# Patient Record
Sex: Female | Born: 1960 | Race: White | Hispanic: No | Marital: Married | Smoking: Never smoker
Health system: Southern US, Community
[De-identification: ages and names within clinical notes are randomized; demographics above are authoritative.]

## PROBLEM LIST (undated history)

## (undated) DIAGNOSIS — G5600 Carpal tunnel syndrome, unspecified upper limb: Secondary | ICD-10-CM

## (undated) DIAGNOSIS — D563 Thalassemia minor: Secondary | ICD-10-CM

## (undated) HISTORY — PX: ABDOMINAL HYSTERECTOMY: SHX81

## (undated) HISTORY — PX: WISDOM TOOTH EXTRACTION: SHX21

## (undated) HISTORY — PX: CHOLECYSTECTOMY: SHX55

## (undated) HISTORY — DX: Thalassemia minor: D56.3

## (undated) HISTORY — PX: TONSILLECTOMY: SUR1361

## (undated) HISTORY — DX: Carpal tunnel syndrome, unspecified upper limb: G56.00

---

## 2005-01-18 ENCOUNTER — Ambulatory Visit: Payer: Self-pay | Admitting: Obstetrics and Gynecology

## 2005-08-30 ENCOUNTER — Ambulatory Visit: Payer: Self-pay | Admitting: Obstetrics and Gynecology

## 2005-09-04 ENCOUNTER — Other Ambulatory Visit: Payer: Self-pay

## 2005-09-04 ENCOUNTER — Emergency Department: Payer: Self-pay | Admitting: Emergency Medicine

## 2005-09-05 ENCOUNTER — Ambulatory Visit: Payer: Self-pay | Admitting: Emergency Medicine

## 2006-01-31 ENCOUNTER — Ambulatory Visit: Payer: Self-pay | Admitting: Obstetrics and Gynecology

## 2007-05-17 ENCOUNTER — Ambulatory Visit: Payer: Self-pay | Admitting: Obstetrics and Gynecology

## 2008-06-05 ENCOUNTER — Ambulatory Visit: Payer: Self-pay | Admitting: Obstetrics and Gynecology

## 2010-07-22 ENCOUNTER — Ambulatory Visit: Payer: Self-pay | Admitting: Obstetrics and Gynecology

## 2014-10-22 ENCOUNTER — Ambulatory Visit
Admit: 2014-10-22 | Disposition: A | Discharge status: Home / Self Care | Payer: Self-pay | Attending: Nurse Practitioner | Admitting: Nurse Practitioner

## 2015-10-28 ENCOUNTER — Ambulatory Visit: Payer: No Typology Code available for payment source | Attending: Internal Medicine | Admitting: Occupational Therapy

## 2015-10-28 ENCOUNTER — Encounter: Payer: Self-pay | Admitting: Occupational Therapy

## 2015-10-28 DIAGNOSIS — M6281 Muscle weakness (generalized): Secondary | ICD-10-CM | POA: Insufficient documentation

## 2015-10-28 DIAGNOSIS — M25642 Stiffness of left hand, not elsewhere classified: Secondary | ICD-10-CM | POA: Diagnosis present

## 2015-10-28 DIAGNOSIS — M79641 Pain in right hand: Secondary | ICD-10-CM | POA: Diagnosis present

## 2015-10-28 DIAGNOSIS — M79642 Pain in left hand: Secondary | ICD-10-CM | POA: Diagnosis present

## 2015-10-28 DIAGNOSIS — M25641 Stiffness of right hand, not elsewhere classified: Secondary | ICD-10-CM | POA: Diagnosis not present

## 2015-10-28 NOTE — Patient Instructions (Signed)
Pt to do contrast in water   Tendon glides  And digits tapping   Ed on joint protection principles and modifications of tasks, hand out provided

## 2015-10-28 NOTE — Therapy (Signed)
[facility] PHYSICAL AND SPORTS MEDICINE [address] [place], [place], [address] Phone: [phone]   Fax:  [phone]  Occupational Therapy Treatment  Patient Details  Name: Renee Hess MRN: [id number] Date of Birth: June 27, 1961 Referring Provider: Renard MatterBock  Encounter Date: 10/28/2015      OT End of Session - 10/28/15 1310    Visit Number 1   Number of Visits 4   Date for OT Re-Evaluation 11/25/15   OT Start Time 0911   OT Stop Time 1005   OT Time Calculation (min) 54 min   Activity Tolerance Patient tolerated treatment well   Behavior During Therapy [facility] for tasks assessed/performed      No past medical history on file.  Past Surgical History  Procedure Laterality Date  . Cholecystectomy    . Abdominal hysterectomy      partial    There were no vitals filed for this visit.      Subjective Assessment - 10/28/15 1305    Subjective  My hands started hurting about  summer of 2016 - took meds did not help - now more stiff and tight - in am and evenings the worse    Patient Stated Goals Want to get my hands better - do not want it to get worse    Pain Score 2    Pain Location Hand   Pain Orientation Right;Left   Pain Descriptors / Indicators Aching            [facility] OT Assessment - 10/28/15 0001    Assessment   Diagnosis inflammatory arthritis   Referring Provider Renard MatterBock   Onset Date 11/27/14   Home  Environment   Lives With Family   Prior Function   Level of Independence Independent   Vocation Full time employment   Leisure R hand dominant - work as [employer] tech , likes to do puzzles, books, games on tablet ,  tex on phone, movies and do own house work    Chiropractortrength   Right Hand Grip (lbs) 35   Right Hand Lateral Pinch 11 lbs   Right Hand 3 Point Pinch 9 lbs   Left Hand Grip (lbs) 21   Left Hand Lateral Pinch 10 lbs   Left Hand 3 Point Pinch 8 lbs   Right Hand AROM   R Index  MCP 0-90 65 Degrees   R Index PIP  0-100 100 Degrees   R Long  MCP 0-90 70 Degrees   R Long PIP 0-100 100 Degrees   R Ring  MCP 0-90 60 Degrees   R Ring PIP 0-100 95 Degrees   R Little  MCP 0-90 75 Degrees   R Little PIP 0-100 95 Degrees   Left Hand AROM   L Index  MCP 0-90 70 Degrees   L Index PIP 0-100 95 Degrees   L Long  MCP 0-90 75 Degrees   L Long PIP 0-100 95 Degrees   L Ring  MCP 0-90 80 Degrees   L Ring PIP 0-100 95 Degrees   L Little  MCP 0-90 85 Degrees   L Little PIP 0-100 95 Degrees      Pt was ed on HEP for contrast - using water, and tendon glides and AROM tapping  Hand out provided  And ed on joint protection principles and modifications                     OT Education - 10/28/15 1310  Education provided Yes   Education Details HEP and education of jooint protection and modifications    Person(s) Educated Patient   Methods Explanation;Demonstration;Tactile cues;Verbal cues   Comprehension Verbal cues required;Returned demonstration;Verbalized understanding          OT Short Term Goals - 10/28/15 1315    OT SHORT TERM GOAL #1   Title Pain on PRWHE improve with 10 points    Baseline pain at eval 21/50    Time 2   Period Weeks   Status New   OT SHORT TERM GOAL #2   Title AROM in digits in bilateral hands improve to touching palm    Baseline see flowsheet - MC's more lmited than PIP's    Time 4   Period Weeks   Status New           OT Long Term Goals - 10/28/15 1316    OT LONG TERM GOAL #1   Title Pt verbalize 3  joint protection and AE to use in ADL's and IADL"s to increase ease   Baseline no knowledge   Time 4   Period Weeks   Status New   OT LONG TERM GOAL #2   Title Grip strength improve in bilateral hands by 5 lbs to be able to grasp, lift or squeeze objects with more ease   Baseline grip decrease  in bilateral hands - see flowsheet   Time 4   Period Weeks   Status New               Plan - 10/28/15 1311    Clinical Impression Statement  Pt  present with increase sitffness, tighness in bilateral hands - with L worse- pt do have some pain and tenderness / edema over L 2nd and 3rd A1pulley's - pt do report that she had some triggering when this started but not the last few months - did activity analysis - but pt do not had period of increase activiities that could brought her symptoms on - pt  do show decrease ROM in MC's more than PIP's , decrease grip strength more than  prehension - makiing it hard for her to use her hands to grip , lift  or squeeze objjects, opening jars ,cutting food    Rehab Potential Fair   OT Frequency 1x / week   OT Duration 4 weeks   OT Treatment/Interventions Self-care/ADL training;Parrafin;Contrast Bath;Fluidtherapy;Therapeutic exercise;Manual Therapy;DME and/or AE instruction;Patient/family education;Passive range of motion   Plan assess progress with HEP , joint protection    OT Home Exercise Plan see pt instruction    Consulted and Agree with Plan of Care Patient      Patient will benefit from skilled therapeutic intervention in order to improve the following deficits and impairments:  Impaired flexibility, Decreased range of motion, Increased edema, Decreased knowledge of use of DME, Impaired UE functional use, Pain, Decreased strength  Visit Diagnosis: Stiffness of right hand, not elsewhere classified - Plan: Ot plan of care cert/re-cert  Stiffness of left hand, not elsewhere classified - Plan: Ot plan of care cert/re-cert  Pain in left hand - Plan: Ot plan of care cert/re-cert  Pain in right hand - Plan: Ot plan of care cert/re-cert  Muscle weakness (generalized) - Plan: Ot plan of care cert/re-cert    Problem List There are no active problems to display for this patient.   [name] OTR/L,CLT  10/28/2015, 1:22 PM  [facility] PHYSICAL AND SPORTS MEDICINE [address] [place], [place], [address] Phone: [phone]   Fax:  [phone]  Name:  Renee Hess MRN: [id number] Date of Birth: 05/11/1961

## 2015-11-04 ENCOUNTER — Ambulatory Visit: Payer: No Typology Code available for payment source | Admitting: Occupational Therapy

## 2015-11-04 DIAGNOSIS — M6281 Muscle weakness (generalized): Secondary | ICD-10-CM

## 2015-11-04 DIAGNOSIS — M25641 Stiffness of right hand, not elsewhere classified: Secondary | ICD-10-CM

## 2015-11-04 DIAGNOSIS — M25642 Stiffness of left hand, not elsewhere classified: Secondary | ICD-10-CM

## 2015-11-04 DIAGNOSIS — M79641 Pain in right hand: Secondary | ICD-10-CM

## 2015-11-04 DIAGNOSIS — M79642 Pain in left hand: Secondary | ICD-10-CM

## 2015-11-04 NOTE — Therapy (Signed)
[facility] PH[facility] MEDICINE [address] [place], [place], [address] Phone: [phone]   Fax:  [phone]  Occupational Therapy Treatment  Patient Details  Name: Renee Hess MRN: [id number] Date of Birth: Sep 05, 1960 Referring Provider: Renard MatterBock  Encounter Date: 11/04/2015      OT End of Session - 11/04/15 1007    Visit Number 2   Number of Visits 4   Date for OT Re-Evaluation 11/25/15   OT Start Time 0920   OT Stop Time 1001   OT Time Calculation (min) 41 min   Activity Tolerance Patient tolerated treatment well   Behavior During Therapy [facility] for tasks assessed/performed      No past medical history on file.  Past Surgical History  Procedure Laterality Date  . Cholecystectomy    . Abdominal hysterectomy      partial    There were no vitals filed for this visit.      Subjective Assessment - 11/04/15 1001    Subjective  My hands are better - not as tight , full feeling - over the knuckles mostly - and I can see my knuckles more - even my ring not as tight    Patient Stated Goals Want to get my hands better - do not want it to get worse    Currently in Pain? No/denies            [facility] Assessment - 11/04/15 0001    Strength   Right Hand Grip (lbs) 35   Right Hand Lateral Pinch 14 lbs   Right Hand 3 Point Pinch 12 lbs   Left Hand Grip (lbs) 26   Left Hand Lateral Pinch 12 lbs   Right Hand AROM   R Index  MCP 0-90 80 Degrees   R Long  MCP 0-90 85 Degrees   R Ring  MCP 0-90 90 Degrees   R Little  MCP 0-90 90 Degrees   Left Hand AROM   L Index  MCP 0-90 80 Degrees   L Long  MCP 0-90 80 Degrees   L Ring  MCP 0-90 85 Degrees   L Little  MCP 0-90 85 Degrees                  OT Treatments/Exercises (OP) - 11/04/15 0001    Ultrasound   Ultrasound Location A1 pulley at 3rd MC L hand at end of session    Ultrasound Parameters 3.3MHZ, 20% and 1.0 intensity - 5 min    Ultrasound Goals Pain        Tapping of digits into extention - each and then all of them  5 reps  Tendon glides  10 reps  Fabricate  MC block splint for 3rd MC on L hand to use with composite fisting activities and sleeping  Pt ed on wearing -  And ed and reviewed again - joint protection - modifications  Pt did had in past R thumb triggering - but no pain with palpation             OT Education - 11/04/15 1007    Education provided Yes   Education Details HEP and jonptproteciton , AE    Person(s) Educated Patient   Methods Explanation;Demonstration;Tactile cues;Verbal cues   Comprehension Returned demonstration;Verbalized understanding          OT Short Term Goals - 10/28/15 1315    OT SHORT TERM GOAL #1   Title Pain on PRWHE improve with  10 points    Baseline pain at eval 21/50    Time 2   Period Weeks   Status New   OT SHORT TERM GOAL #2   Title AROM in digits in bilateral hands improve to touching palm    Baseline see flowsheet - MC's more lmited than PIP's    Time 4   Period Weeks   Status New           OT Long Term Goals - 10/28/15 1316    OT LONG TERM GOAL #1   Title Pt verbalize 3  joint protection and AE to use in ADL's and IADL"s to increase ease   Baseline no knowledge   Time 4   Period Weeks   Status New   OT LONG TERM GOAL #2   Title Grip strength improve in bilateral hands by 5 lbs to be able to grasp, lift or squeeze objects with more ease   Baseline grip decrease  in bilateral hands - see flowsheet   Time 4   Period Weeks   Status New               Plan - 11/04/15 1008    Clinical Impression Statement Pt showed improvement in tightness and sitffness - showed great progress in bilateral MC flexion , pain , and grip/prehension strength - pt report she got some of the AE to use at home - and doing much better-  still some tight feel in 3rd digits L hand and maybe some triggering or want to happend but not actually    Rehab Potential Fair   OT Frequency  1x / week   OT Duration 4 weeks   OT Treatment/Interventions Self-care/ADL training;Parrafin;Contrast Bath;Fluidtherapy;Therapeutic exercise;Manual Therapy;DME and/or AE instruction;Patient/family education;Passive range of motion   Plan assess progress and use of HEP and splint    OT Home Exercise Plan see pt instruction    Consulted and Agree with Plan of Care Patient      Patient will benefit from skilled therapeutic intervention in order to improve the following deficits and impairments:  Impaired flexibility, Decreased range of motion, Increased edema, Decreased knowledge of use of DME, Impaired UE functional use, Pain, Decreased strength  Visit Diagnosis: Stiffness of right hand, not elsewhere classified  Stiffness of left hand, not elsewhere classified  Pain in left hand  Pain in right hand  Muscle weakness (generalized)    Problem List There are no active problems to display for this patient.   [name] OTR/L,CLT  11/04/2015, 10:11 AM  [facility] PHYSICAL AND SPORTS MEDICINE [address] [place], [address] Phone: [phone]   Fax:  [phone]  Name: Renee Hess MRN: [id number] Date of Birth: 09/20/1960

## 2015-11-04 NOTE — Patient Instructions (Addendum)
HEP - Tapping of digits into extention - each and then all of them  5 reps  Tendon glides  10 reps  Fabricate  MC block splint for 3rd MC on L hand to use with composite fisting activities and sleeping  Pt ed on wearing -  And ed and reviewed again - joint protection - modifications

## 2015-11-11 ENCOUNTER — Ambulatory Visit: Payer: No Typology Code available for payment source | Admitting: Occupational Therapy

## 2015-11-11 DIAGNOSIS — M25641 Stiffness of right hand, not elsewhere classified: Secondary | ICD-10-CM | POA: Diagnosis not present

## 2015-11-11 DIAGNOSIS — M6281 Muscle weakness (generalized): Secondary | ICD-10-CM

## 2015-11-11 DIAGNOSIS — M25642 Stiffness of left hand, not elsewhere classified: Secondary | ICD-10-CM

## 2015-11-11 DIAGNOSIS — M79641 Pain in right hand: Secondary | ICD-10-CM

## 2015-11-11 DIAGNOSIS — M79642 Pain in left hand: Secondary | ICD-10-CM

## 2015-11-11 NOTE — Therapy (Signed)
[facility] PHYSICAL AND SPORTS MEDICINE [address] [place], [place], [address] Phone: [phone]   Fax:  [phone]  Occupational Therapy Treatment  Patient Details  Name: Renee Hess MRN: [id number] Date of Birth: Oct 29, 1960 Referring Provider: Renard MatterBock  Encounter Date: 11/11/2015      OT End of Session - 11/11/15 1759    Visit Number 3   Date for OT Re-Evaluation 11/25/15   OT Start Time 0910   OT Stop Time 1000   OT Time Calculation (min) 50 min   Activity Tolerance Patient tolerated treatment well   Behavior During Therapy [facility] for tasks assessed/performed      No past medical history on file.  Past Surgical History  Procedure Laterality Date  . Cholecystectomy    . Abdominal hysterectomy      partial    There were no vitals filed for this visit.      Subjective Assessment - 11/11/15 1754    Subjective  My hands are much better - I did not do contrast since weekend - fingers little tight - but tenderness at my ring finger better - I am sleeping with that plastic ring splint you made - not locking or triggering feel   Patient Stated Goals Want to get my hands better - do not want it to get worse    Currently in Pain? No/denies            [facility] - 11/11/15 0001    Strength   Right Hand Grip (lbs) 40   Right Hand Lateral Pinch 15 lbs   Right Hand 3 Point Pinch 14 lbs   Left Hand Grip (lbs) 30   Left Hand Lateral Pinch 13 lbs   Left Hand 3 Point Pinch 13 lbs   Right Hand AROM   R Index  MCP 0-90 80 Degrees   R Long  MCP 0-90 88 Degrees   R Ring  MCP 0-90 90 Degrees   R Little  MCP 0-90 90 Degrees   Left Hand AROM   L Index  MCP 0-90 85 Degrees   L Long  MCP 0-90 85 Degrees   L Ring  MCP 0-90 90 Degrees   L Little  MCP 0-90 90 Degrees                  OT Treatments/Exercises (OP) - 11/11/15 0001    Ultrasound   Ultrasound Location A[facility]pulley [facility] L hand at end of session    Ultrasound Parameters 3.3MHZ 20% and 1.0 intensity  for 4 min each hand    Ultrasound Goals Edema;Pain      Measurements taken see flowsheet- for ROM and grip /prehension  As well as PRWHE  Great progress   Reviewed with pt AE and joint protection  As well as healthy lifestyle  Reviewed HEP to do contrast as needed and tendon glides /tapping of digits  As well as use of MC block splint for ring finger where A1 pulley was tender            OT Education - 11/11/15 1758    Education provided Yes   Education Details HEP and joint protection    Person(s) Educated Patient   Methods Explanation;Demonstration;Tactile cues;Verbal cues   Comprehension Verbal cues required;Returned demonstration;Verbalized understanding          OT Short Term Goals - 11/11/15 1801    OT SHORT TERM GOAL #1   Title Pain on PRWHE  improve with 10 points    Baseline pain at eval 21/50  and now 9/50   Status Achieved   OT SHORT TERM GOAL #2   Title AROM in digits in bilateral hands improve to touching palm    Baseline See flowsheet- till feel tight some days    Status Achieved           OT Long Term Goals - 11/11/15 1802    OT LONG TERM GOAL #1   Title Pt verbalize 3  joint protection and AE to use in ADL's and IADL"s to increase ease   Status Achieved   OT LONG TERM GOAL #2   Title Grip strength improve in bilateral hands by 5 lbs to be able to grasp, lift or squeeze objects with more ease   Baseline grip improve see flowsheet   Status Achieved               Plan - 11/11/15 1759    Clinical Impression Statement Pt made great progress in pain , AROM , grip strength and prehension - as well as knowledge on AE and joint protection - pt do want to be able to come back in 2-3 wks or phone if she needs to be seen one more time - will do HEP on her own until then    Rehab Potential Fair   OT Frequency Biweekly   OT Duration 2 weeks   OT Treatment/Interventions Self-care/ADL  training;Parrafin;Contrast Bath;Fluidtherapy;Therapeutic exercise;Manual Therapy;DME and/or AE instruction;Patient/family education;Passive range of motion   Plan Pt to phone in 2-3 wks if need to come in - will do HEP in meantime    OT Home Exercise Plan see pt instruction    Consulted and Agree with Plan of Care Patient      Patient will benefit from skilled therapeutic intervention in order to improve the following deficits and impairments:  Impaired flexibility, Decreased range of motion, Increased edema, Decreased knowledge of use of DME, Impaired UE functional use, Pain, Decreased strength  Visit Diagnosis: Stiffness of right hand, not elsewhere classified  Stiffness of left hand, not elsewhere classified  Pain in left hand  Pain in right hand  Muscle weakness (generalized)    Problem List There are no active problems to display for this patient.   [name] OTR/L,CLT  11/11/2015, 6:04 PM  [facility] PHYSICAL [facility] SPORTS MEDICINE [address], [place], [address] Phone: [phone]   Fax:  [phone]  Name: Renee Hess MRN: [id number] Date of Birth: May 27, 1961

## 2015-11-11 NOTE — Patient Instructions (Signed)
   Reviewed with pt AE and joint protection  As well as healthy lifestyle  Reviewed HEP to do contrast as needed and tendon glides /tapping of digits  As well as use of MC block splint for ring finger where A1 pulley was tender

## 2016-11-10 ENCOUNTER — Encounter: Payer: Self-pay | Admitting: Obstetrics and Gynecology

## 2016-11-10 ENCOUNTER — Ambulatory Visit (INDEPENDENT_AMBULATORY_CARE_PROVIDER_SITE_OTHER): Payer: Managed Care, Other (non HMO) | Admitting: Obstetrics and Gynecology

## 2016-11-10 VITALS — BP 108/68 | HR 87 | Ht 64.0 in | Wt 167.0 lb

## 2016-11-10 DIAGNOSIS — Z1211 Encounter for screening for malignant neoplasm of colon: Secondary | ICD-10-CM | POA: Diagnosis not present

## 2016-11-10 DIAGNOSIS — Z13220 Encounter for screening for lipoid disorders: Secondary | ICD-10-CM | POA: Diagnosis not present

## 2016-11-10 DIAGNOSIS — Z01419 Encounter for gynecological examination (general) (routine) without abnormal findings: Secondary | ICD-10-CM

## 2016-11-10 DIAGNOSIS — Z131 Encounter for screening for diabetes mellitus: Secondary | ICD-10-CM | POA: Diagnosis not present

## 2016-11-10 DIAGNOSIS — Z0000 Encounter for general adult medical examination without abnormal findings: Secondary | ICD-10-CM

## 2016-11-10 DIAGNOSIS — Z1231 Encounter for screening mammogram for malignant neoplasm of breast: Secondary | ICD-10-CM | POA: Diagnosis not present

## 2016-11-10 DIAGNOSIS — Z1321 Encounter for screening for nutritional disorder: Secondary | ICD-10-CM

## 2016-11-10 DIAGNOSIS — Z1329 Encounter for screening for other suspected endocrine disorder: Secondary | ICD-10-CM | POA: Diagnosis not present

## 2016-11-10 DIAGNOSIS — Z1239 Encounter for other screening for malignant neoplasm of breast: Secondary | ICD-10-CM

## 2016-11-10 LAB — HEMOCCULT GUIAC POC 1CARD (OFFICE): Fecal Occult Blood, POC: NEGATIVE

## 2016-11-10 NOTE — Addendum Note (Signed)
Addended by: Althea GrimmerOPLAND, Yehudit Fulginiti B on: 11/10/2016 11:16 AM   Modules accepted: Orders

## 2016-11-10 NOTE — Progress Notes (Addendum)
Chief Complaint  Patient presents with  . Gynecologic Exam    HPI:      [name] is a 56 y.o. [phone] who LMP was No LMP recorded. Patient is postmenopausal., presents today for her annual examination.  Her menses are absent due to surpacx lyst due to leio. She does not have intermenstrual bleeding.  She does have vasomotor sx.  Sex activity: single partner, contraception - status post hysterectomy. She does not have vaginal dryness.  Last Pap: September 10, 2014  Results were: no abnormalities /neg HPV DNA.  Hx of STDs: none  Last mammogram: October 22, 2014  Results were: normal--routine follow-up in 12 months There is no FH of breast cancer. There is no FH of ovarian cancer. The patient does not do self-breast exams.  Colonoscopy: never. Pt wants referral.  Tobacco use: The patient denies current or previous tobacco use. Alcohol use: none Exercise: not active  She does get adequate calcium and Vitamin D in her diet. She had routine labs 2016 with pre-DM and elevated lipids. She is due for repeat labs. She doesn't have PCP currently.  She has upcoming carpal tunnel surgery.  Past Medical History:  Diagnosis Date  . Alpha thalassemia trait   . Beta thalassemia trait   . Carpal tunnel syndrome     Past Surgical History:  Procedure Laterality Date  . ABDOMINAL HYSTERECTOMY     partial; supracervical due to leio  . CHOLECYSTECTOMY    . TONSILLECTOMY    . WISDOM TOOTH EXTRACTION      Family History  Problem Relation Age of Onset  . Heart block Mother     Social History   Social History  . Marital status: Married    Spouse name: N/A  . Number of children: N/A  . Years of education: N/A   Occupational History  . Not on file.   Social History Main Topics  . Smoking status: Never Smoker  . Smokeless tobacco: Never Used  . Alcohol use No  . Drug use: No  . Sexual activity: Yes    Birth control/ protection: Post-menopausal   Other Topics Concern    . Not on file   Social History Narrative  . No narrative on file     Current Outpatient Prescriptions:  .  Borage, Borago officinalis, (BORAGE OIL) 1000 MG CAPS, Take by mouth., Disp: , Rfl:  .  Multiple Vitamin (MULTIVITAMIN) tablet, Take 1 tablet by mouth daily., Disp: , Rfl:  .  triamcinolone cream (KENALOG) 0.1 %, triamcinolone acetonide 0.1 % topical cream, Disp: , Rfl:    ROS:  Review of Systems  Constitutional: Negative for fatigue, fever and unexpected weight change.  Respiratory: Negative for cough, shortness of breath and wheezing.   Cardiovascular: Negative for chest pain, palpitations and leg swelling.  Gastrointestinal: Negative for blood in stool, constipation, diarrhea, nausea and vomiting.  Endocrine: Negative for cold intolerance, heat intolerance and polyuria.  Genitourinary: Negative for dyspareunia, dysuria, flank pain, frequency, genital sores, hematuria, menstrual problem, pelvic pain, urgency, vaginal bleeding, vaginal discharge and vaginal pain.  Musculoskeletal: Positive for arthralgias. Negative for back pain, joint swelling and myalgias.  Skin: Negative for rash.  Neurological: Positive for numbness. Negative for dizziness, syncope, light-headedness and headaches.  Hematological: Negative for adenopathy.  Psychiatric/Behavioral: Negative for agitation, confusion, sleep disturbance and suicidal ideas. The patient is not nervous/anxious.      Objective: BP 108/68   Pulse 87   Ht 5\' 4"  (1.626 m)  Wt 167 lb (75.8 kg)   BMI 28.67 kg/m    Physical Exam  Constitutional: She is oriented to person, place, and time. She appears well-developed and well-nourished.  Genitourinary: Vagina normal. There is no rash or tenderness on the right labia. There is no rash or tenderness on the left labia. No erythema or tenderness in the vagina. No vaginal discharge found. Right adnexum does not display mass and does not display tenderness. Left adnexum does not display  mass and does not display tenderness. Cervix does not exhibit motion tenderness or polyp.  Genitourinary Comments: UTERUS SURG ABSENT  Neck: Normal range of motion. No thyromegaly present.  Cardiovascular: Normal rate, regular rhythm and normal heart sounds.   No murmur heard. Pulmonary/Chest: Effort normal and breath sounds normal. Right breast exhibits no mass, no nipple discharge, no skin change and no tenderness. Left breast exhibits no mass, no nipple discharge, no skin change and no tenderness.  Abdominal: Soft. There is no tenderness. There is no guarding.  Musculoskeletal: Normal range of motion.  Neurological: She is alert and oriented to person, place, and time. No cranial nerve deficit.  Psychiatric: She has a normal mood and affect. Her behavior is normal.  Vitals reviewed.   Results: Results for orders placed or performed in visit on 11/10/16 (from the past 24 hour(s))  POCT Occult Blood Stool     Status: Normal   Collection Time: 11/10/16  9:56 AM  Result Value Ref Range   Fecal Occult Blood, POC Negative Negative   Card #1 Date     Card #2 Fecal Occult Blod, POC     Card #2 Date     Card #3 Fecal Occult Blood, POC     Card #3 Date      Assessment/Plan:  Encounter for annual routine gynecological examination  Screening for breast cancer - Pt to sched mammo. - Plan: MM DIGITAL SCREENING BILATERAL  Screening for colon cancer - Neg FOBT. Refer to GI for scr colonoscopy due to age. - Plan: POCT Occult Blood Stool, Ambulatory referral to Gastroenterology  Blood tests for routine general physical examination - Plan: Comprehensive metabolic panel, Lipid panel, TSH + free T4, Hemoglobin A1c, VITAMIN D 25 Hydroxy (Vit-D Deficiency, Fractures)  Screening cholesterol level - Plan: Lipid panel  Encounter for vitamin deficiency screening - Plan: VITAMIN D 25 Hydroxy (Vit-D Deficiency, Fractures)  Thyroid disorder screening - Plan: TSH + free T4  Screening for diabetes  mellitus - Plan: Hemoglobin A1c         GYN counsel mammography screening, adequate intake of calcium and vitamin D, diet and exercise     F/U  Return in about 1 year (around 11/10/2017).  Darcella Shiffman B. Audry Pecina, PA-C 11/10/2016 11:15 AM

## 2017-02-23 ENCOUNTER — Telehealth: Payer: Self-pay | Admitting: Gastroenterology

## 2017-02-23 NOTE — Telephone Encounter (Signed)
Patient called to schedule her colonscopy

## 2017-02-24 ENCOUNTER — Telehealth: Payer: Self-pay

## 2017-02-24 NOTE — Telephone Encounter (Signed)
Pt calling for referral for colonoscopy.  [facility]. She called them and was told she would need an appt c doctor there first.  Adv we will send information and she would be notified by either us or [facility].  [phone](w) or [phone].

## 2017-02-24 NOTE — Telephone Encounter (Signed)
LVM for patient callback to schedule colonoscopy.  

## 2017-02-25 ENCOUNTER — Other Ambulatory Visit: Payer: Self-pay | Admitting: Obstetrics and Gynecology

## 2017-02-25 DIAGNOSIS — Z1211 Encounter for screening for malignant neoplasm of colon: Secondary | ICD-10-CM

## 2017-02-25 NOTE — Telephone Encounter (Signed)
Did you ever get this ref from 5/18?

## 2017-02-25 NOTE — Telephone Encounter (Signed)
Referral faxed. [facility] to contact the patient directly to schedule an appointment.

## 2017-02-25 NOTE — Telephone Encounter (Signed)
Pt already aware in previous conversation.  Task sent to [place].

## 2017-02-25 NOTE — Telephone Encounter (Signed)
RN to relay Nancy's message and let her she will now do [facility] GI ref for pt.

## 2017-03-02 ENCOUNTER — Other Ambulatory Visit: Payer: Managed Care, Other (non HMO)

## 2017-03-09 ENCOUNTER — Ambulatory Visit
Admission: RE | Admit: 2017-03-09 | Discharge: 2017-03-09 | Disposition: A | Discharge status: Home / Self Care | Payer: 59 | Source: Ambulatory Visit | Attending: Obstetrics and Gynecology | Admitting: Obstetrics and Gynecology

## 2017-03-09 DIAGNOSIS — R928 Other abnormal and inconclusive findings on diagnostic imaging of breast: Secondary | ICD-10-CM | POA: Insufficient documentation

## 2017-03-09 DIAGNOSIS — Z1231 Encounter for screening mammogram for malignant neoplasm of breast: Secondary | ICD-10-CM | POA: Diagnosis present

## 2017-03-09 DIAGNOSIS — Z1239 Encounter for other screening for malignant neoplasm of breast: Secondary | ICD-10-CM

## 2017-03-09 DIAGNOSIS — N6489 Other specified disorders of breast: Secondary | ICD-10-CM | POA: Insufficient documentation

## 2017-03-10 ENCOUNTER — Other Ambulatory Visit: Payer: Self-pay | Admitting: Obstetrics and Gynecology

## 2017-03-10 DIAGNOSIS — N6489 Other specified disorders of breast: Secondary | ICD-10-CM

## 2017-03-10 DIAGNOSIS — R928 Other abnormal and inconclusive findings on diagnostic imaging of breast: Secondary | ICD-10-CM

## 2017-03-16 ENCOUNTER — Other Ambulatory Visit: Payer: 59

## 2017-03-16 DIAGNOSIS — Z1329 Encounter for screening for other suspected endocrine disorder: Secondary | ICD-10-CM

## 2017-03-16 DIAGNOSIS — Z131 Encounter for screening for diabetes mellitus: Secondary | ICD-10-CM

## 2017-03-16 DIAGNOSIS — Z13220 Encounter for screening for lipoid disorders: Secondary | ICD-10-CM

## 2017-03-16 DIAGNOSIS — Z0000 Encounter for general adult medical examination without abnormal findings: Secondary | ICD-10-CM

## 2017-03-16 DIAGNOSIS — Z1321 Encounter for screening for nutritional disorder: Secondary | ICD-10-CM

## 2017-03-17 LAB — COMPREHENSIVE METABOLIC PANEL
A/G RATIO: 1.8 (ref 1.2–2.2)
ALBUMIN: 4.6 g/dL (ref 3.5–5.5)
ALT: 34 IU/L — ABNORMAL HIGH (ref 0–32)
AST: 39 IU/L (ref 0–40)
Alkaline Phosphatase: 95 IU/L (ref 39–117)
BUN/Creatinine Ratio: 20 (ref 9–23)
BUN: 13 mg/dL (ref 6–24)
Bilirubin Total: 0.3 mg/dL (ref 0.0–1.2)
CO2: 26 mmol/L (ref 20–29)
Calcium: 9.9 mg/dL (ref 8.7–10.2)
Chloride: 103 mmol/L (ref 96–106)
Creatinine, Ser: 0.64 mg/dL (ref 0.57–1.00)
GFR calc Af Amer: 117 mL/min/{1.73_m2} (ref >59)
GFR calc non Af Amer: 101 mL/min/{1.73_m2} (ref >59)
Globulin, Total: 2.5 g/dL (ref 1.5–4.5)
Glucose: 91 mg/dL (ref 65–99)
Potassium: 4.8 mmol/L (ref 3.5–5.2)
Sodium: 142 mmol/L (ref 134–144)
TOTAL PROTEIN: 7.1 g/dL (ref 6.0–8.5)

## 2017-03-17 LAB — TSH+FREE T4
Free T4: 1.13 ng/dL (ref 0.82–1.77)
TSH: 3.38 u[IU]/mL (ref 0.450–4.500)

## 2017-03-17 LAB — LIPID PANEL
Chol/HDL Ratio: 5.3 ratio — ABNORMAL HIGH (ref 0.0–4.4)
Cholesterol, Total: 163 mg/dL (ref 100–199)
HDL: 31 mg/dL — AB (ref >39)
LDL CALC: 93 mg/dL (ref 0–99)
Triglycerides: 194 mg/dL — ABNORMAL HIGH (ref 0–149)
VLDL Cholesterol Cal: 39 mg/dL (ref 5–40)

## 2017-03-17 LAB — HEMOGLOBIN A1C
Est. average glucose Bld gHb Est-mCnc: 120 mg/dL
Hgb A1c MFr Bld: 5.8 % — ABNORMAL HIGH (ref 4.8–5.6)

## 2017-03-17 LAB — VITAMIN D 25 HYDROXY (VIT D DEFICIENCY, FRACTURES): VIT D 25 HYDROXY: 29.8 ng/mL — AB (ref 30.0–100.0)

## 2017-03-24 ENCOUNTER — Ambulatory Visit
Admission: RE | Admit: 2017-03-24 | Discharge: 2017-03-24 | Disposition: A | Discharge status: Home / Self Care | Payer: 59 | Source: Ambulatory Visit | Attending: Obstetrics and Gynecology | Admitting: Obstetrics and Gynecology

## 2017-03-24 ENCOUNTER — Encounter: Payer: Self-pay | Admitting: Obstetrics and Gynecology

## 2017-03-24 DIAGNOSIS — R928 Other abnormal and inconclusive findings on diagnostic imaging of breast: Secondary | ICD-10-CM | POA: Diagnosis not present

## 2017-03-24 DIAGNOSIS — N6489 Other specified disorders of breast: Secondary | ICD-10-CM | POA: Diagnosis not present

## 2018-04-11 IMAGING — MG MM DIGITAL DIAGNOSTIC UNILAT*L* W/ TOMO W/ CAD
9 series · 9 of 21 positions shown · non-contrast
Comparison: Previous exam(s).

CLINICAL DATA: Screening recall for left breast asymmetry seen on
the CC view only.

EXAM:
2D DIGITAL DIAGNOSTIC UNILATERAL LEFT MAMMOGRAM WITH CAD AND ADJUNCT
TOMO

[L XCCL]
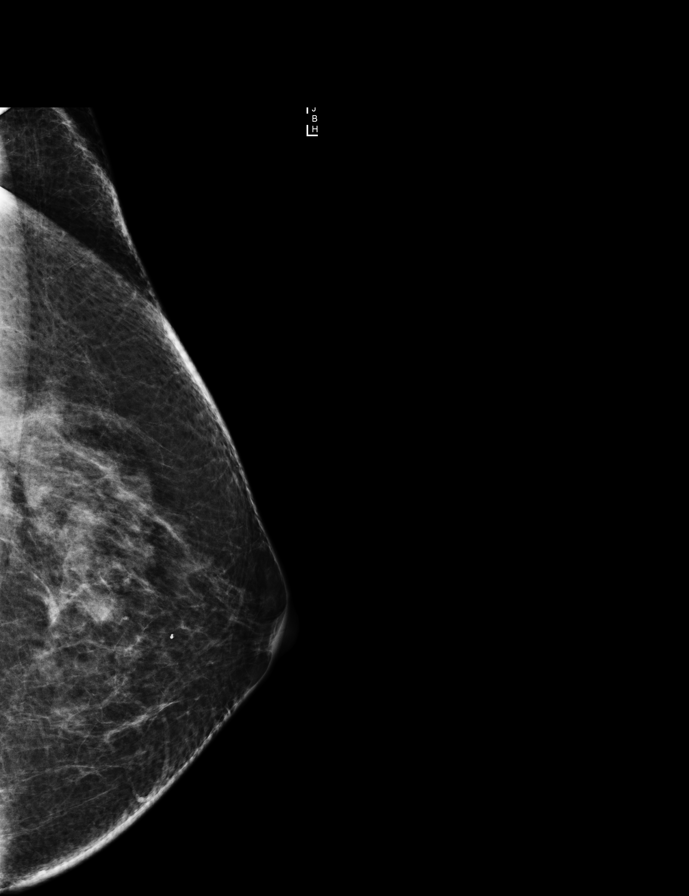

[L XCCL synth-2D]
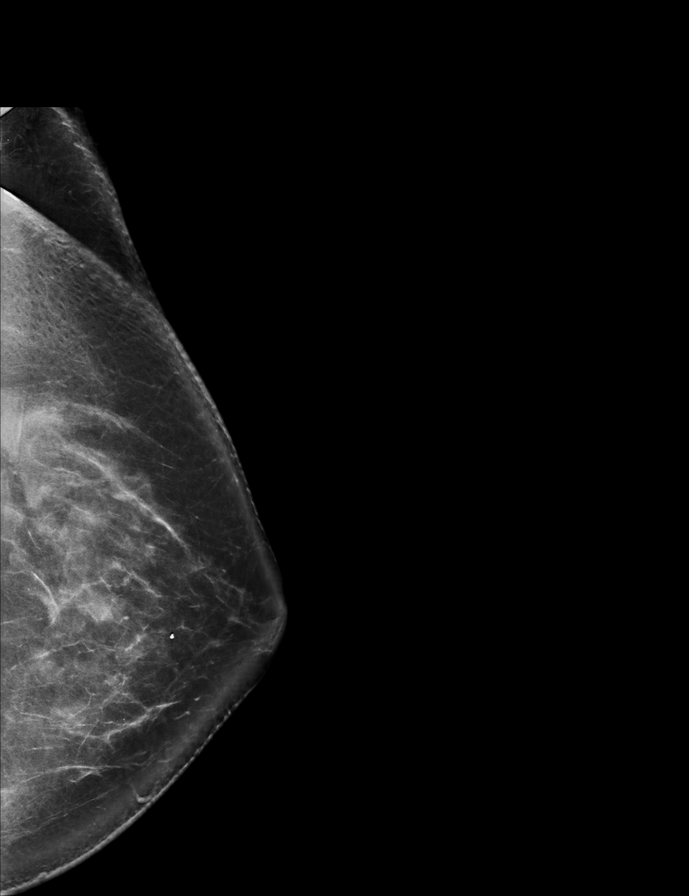

[L MLO]
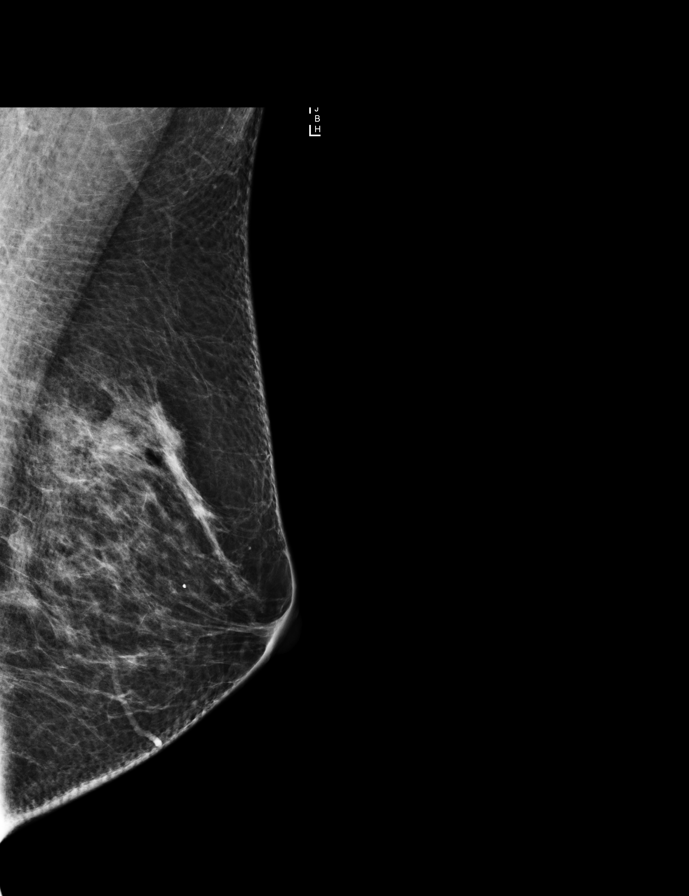

[L CC]
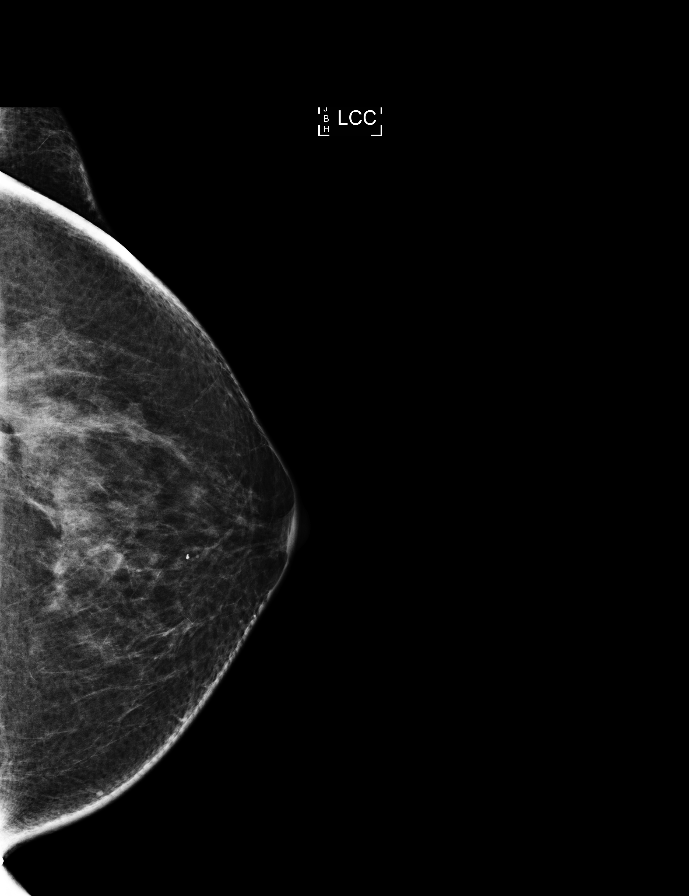

[L CC synth-2D]
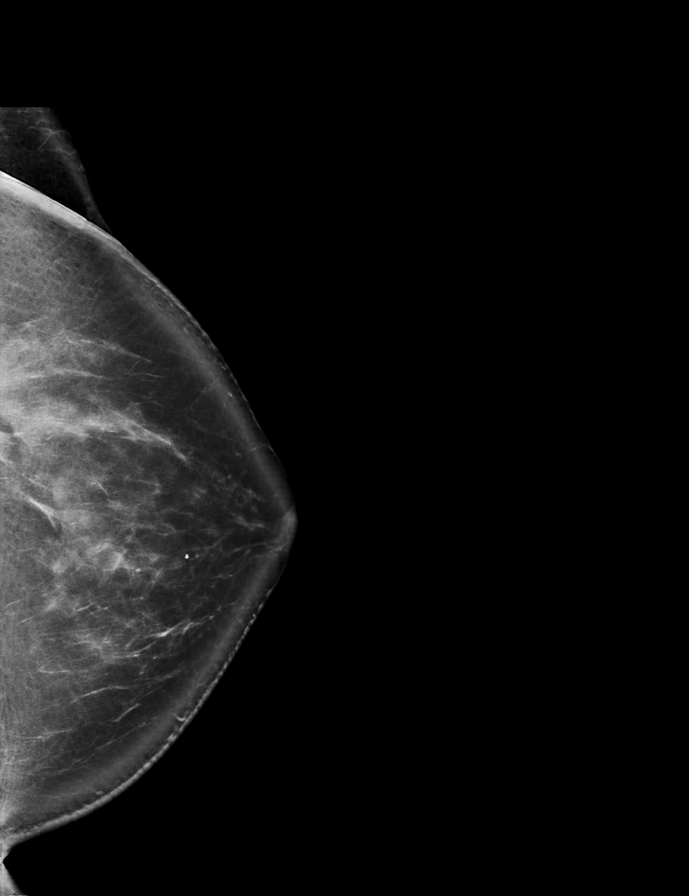

[L MLO synth-2D]
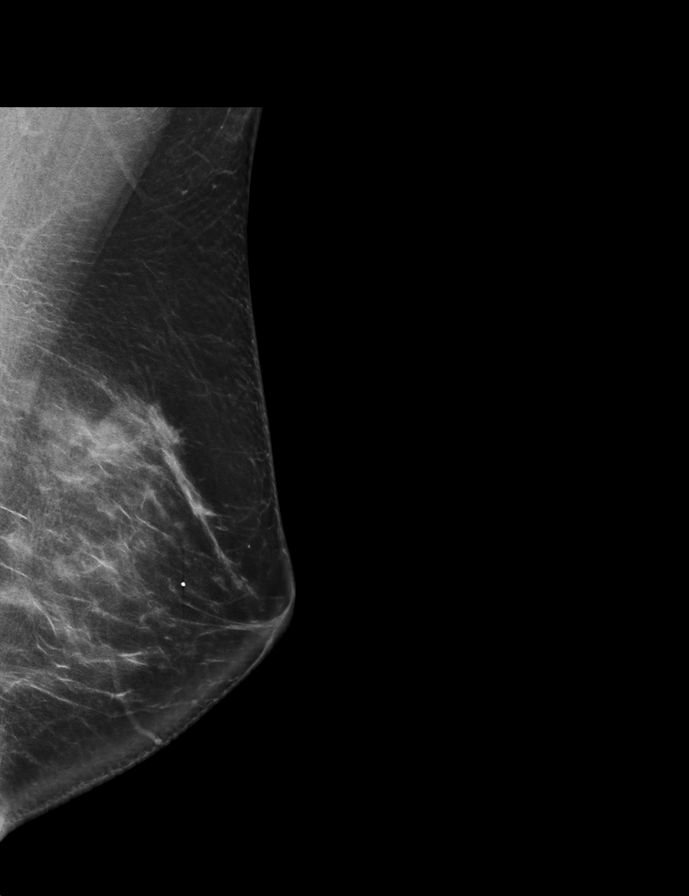

[L XCCL tomo · tomo slice 45/88.0]
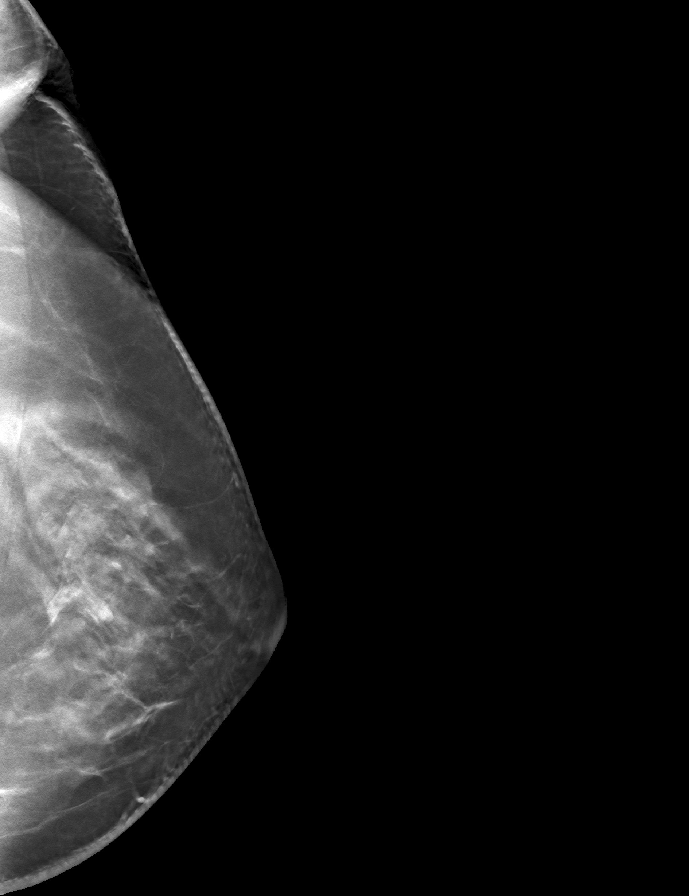

[L CC tomo · tomo slice 47/92.0]
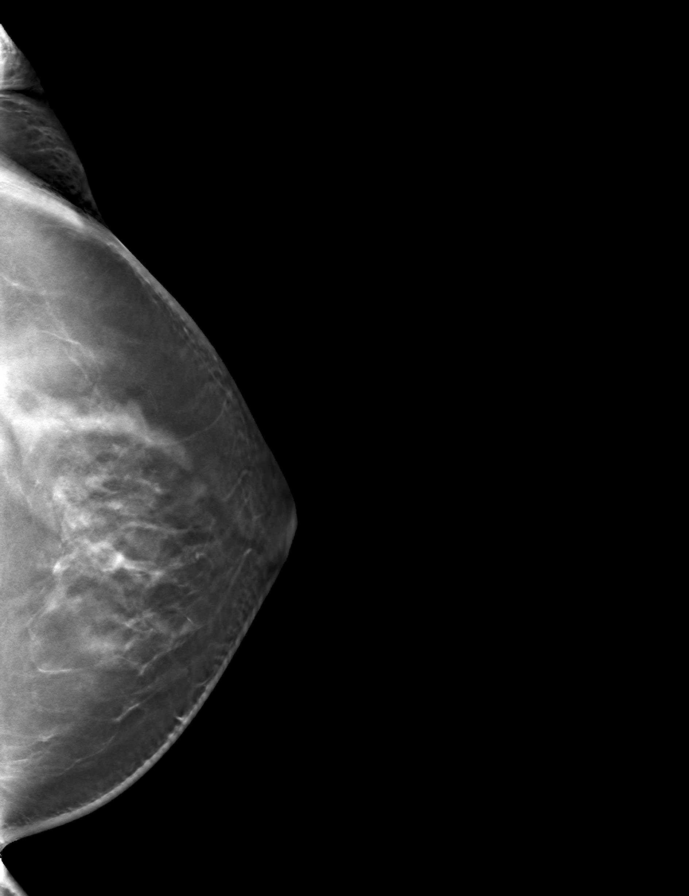

[L MLO tomo · tomo slice 44/87.0]
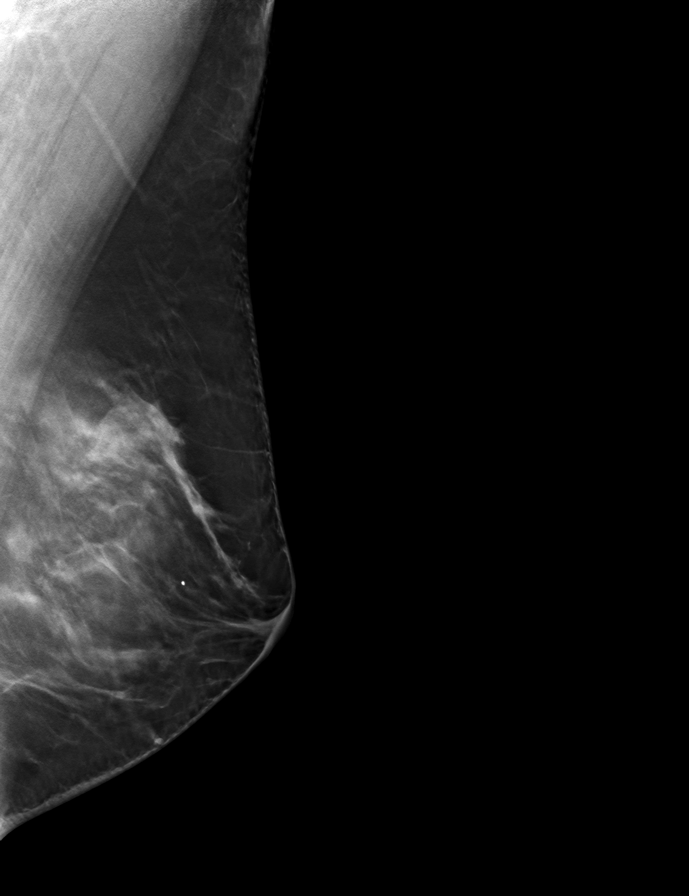

[9 of 21 positions shown; findings below may reference images not displayed]

ACR Breast Density Category b: There are scattered areas of
fibroglandular density.
FINDINGS: Cc and MLO tomograms were performed of the left breast. The
initially questioned possible left breast asymmetry resolves on the
additional imaging with findings compatible with overlapping
heterogeneous fibroglandular tissue. There is no mammographic
evidence of malignancy in the left breast.

Mammographic images were processed with CAD.
IMPRESSION: No mammographic evidence of malignancy in the left breast.

RECOMMENDATION:
Screening mammogram in one year.(Code:[id number]-3SP)

I have discussed the findings and recommendations with the patient.
Results were also provided in writing at the conclusion of the
visit. If applicable, a reminder letter will be sent to the patient
regarding the next appointment.

BI-RADS CATEGORY  1: Negative.

## 2020-05-30 ENCOUNTER — Other Ambulatory Visit: Payer: Self-pay | Admitting: Internal Medicine

## 2020-05-30 ENCOUNTER — Ambulatory Visit: Payer: Self-pay | Attending: Internal Medicine

## 2020-05-30 DIAGNOSIS — Z23 Encounter for immunization: Secondary | ICD-10-CM

## 2020-05-30 NOTE — Progress Notes (Signed)
   Covid-[facility] Vaccination [facility]  Name:  Renee Hess    MRN: [id number] DOB: [birth year]  05/30/2020  [name] was observed post Covid-19 immunization for 15 minutes without incident. She was provided with Vaccine Information Sheet and instruction to access the V-Safe system.   [name] was instructed to call 911 with any severe reactions post vaccine: Marland Kitchen Difficulty breathing  . Swelling of face and throat  . A fast heartbeat  . A bad rash all over body  . Dizziness and weakness   Immunizations Administered    Name Date Dose VIS Date Route   Pfizer COVID-19 Vaccine 05/30/2020 11:24 AM 0.3 mL 04/16/2020 Intramuscular   Manufacturer: [employer], [employer]   Lot: [id number]   NDC: [address]-3
